# Patient Record
Sex: Male | Born: 1995 | Race: White | Hispanic: Yes | Marital: Single | Smoking: Current every day smoker
Health system: Southern US, Community
[De-identification: ages and names within clinical notes are randomized; demographics above are authoritative.]

---

## 2018-06-03 DIAGNOSIS — J45909 Unspecified asthma, uncomplicated: Secondary | ICD-10-CM | POA: Diagnosis not present

## 2018-06-03 DIAGNOSIS — J069 Acute upper respiratory infection, unspecified: Secondary | ICD-10-CM | POA: Diagnosis not present

## 2018-08-03 DIAGNOSIS — R110 Nausea: Secondary | ICD-10-CM | POA: Diagnosis not present

## 2019-08-16 ENCOUNTER — Encounter (HOSPITAL_COMMUNITY): Payer: Self-pay

## 2019-08-16 ENCOUNTER — Other Ambulatory Visit: Payer: Self-pay

## 2019-08-16 ENCOUNTER — Emergency Department (HOSPITAL_COMMUNITY)
Admission: EM | Admit: 2019-08-16 | Discharge: 2019-08-16 | Disposition: A | Discharge status: Home / Self Care | Payer: Worker's Compensation | Attending: Emergency Medicine | Admitting: Emergency Medicine

## 2019-08-16 ENCOUNTER — Emergency Department (HOSPITAL_COMMUNITY): Payer: Worker's Compensation

## 2019-08-16 DIAGNOSIS — Y939 Activity, unspecified: Secondary | ICD-10-CM | POA: Diagnosis not present

## 2019-08-16 DIAGNOSIS — S60352A Superficial foreign body of left thumb, initial encounter: Secondary | ICD-10-CM | POA: Diagnosis not present

## 2019-08-16 DIAGNOSIS — F17210 Nicotine dependence, cigarettes, uncomplicated: Secondary | ICD-10-CM | POA: Insufficient documentation

## 2019-08-16 DIAGNOSIS — W298XXA Contact with other powered powered hand tools and household machinery, initial encounter: Secondary | ICD-10-CM | POA: Diagnosis not present

## 2019-08-16 DIAGNOSIS — Y929 Unspecified place or not applicable: Secondary | ICD-10-CM | POA: Insufficient documentation

## 2019-08-16 DIAGNOSIS — Y999 Unspecified external cause status: Secondary | ICD-10-CM | POA: Insufficient documentation

## 2019-08-16 MED ORDER — CEPHALEXIN 500 MG PO CAPS: 500.0000 mg | ORAL_CAPSULE | Freq: Four times a day (QID) | ORAL | 0 refills | Status: AC

## 2019-08-16 MED ORDER — LIDOCAINE HCL (PF) 1 % IJ SOLN
10.0000 mL | Freq: Once | INTRAMUSCULAR | Status: AC
  Administered 2019-08-16: 18:00:00 10 mL
  Filled 2019-08-16: qty 10

## 2019-08-16 MED ORDER — TETANUS-DIPHTH-ACELL PERTUSSIS 5-2.5-18.5 LF-MCG/0.5 IM SUSP
0.5000 mL | Freq: Once | INTRAMUSCULAR | Status: DC
  Filled 2019-08-16: qty 0.5

## 2019-08-16 MED ORDER — CEPHALEXIN 250 MG PO CAPS
500.0000 mg | ORAL_CAPSULE | Freq: Once | ORAL | Status: AC
  Administered 2019-08-16: 18:00:00 500 mg via ORAL
  Filled 2019-08-16: qty 2

## 2019-08-16 NOTE — ED Triage Notes (Signed)
Pt reports he was working this morning and stapled his finger. Pt has a staple in his left thumb.

## 2019-08-16 NOTE — ED Provider Notes (Signed)
[name] EMERGENCY DEPARTMENT Provider Note   CSN: [id number] Arrival date & time: 08/16/19  1611     History   Chief Complaint Chief Complaint  Patient presents with  . Suture / Staple Removal    HPI Keith Terry is a 23 y.o. male who is previously healthy who presents with staple through his left hand.  He is working with a [employer] gun and accidentally hit his left thumb.  He is right-handed.  He has no range of motion difficulties or sensation deficits.  He was sent from a clinic for further evaluation.  His tetanus is updated there.  He denies any other injuries.     HPI  History reviewed. No pertinent past medical history.  There are no active problems to display for this patient.   History reviewed. No pertinent surgical history.      Home Medications    Prior to Admission medications   Medication Sig Start Date End Date Taking? Authorizing Provider  cephALEXin (KEFLEX) 500 MG capsule Take 1 capsule (500 mg total) by mouth 4 (four) times daily. 08/16/19   Emi Holes, PA-C    Family History History reviewed. No pertinent family history.  Social History Social History   Tobacco Use  . Smoking status: Current Every Day Smoker    Types: Cigarettes  . Smokeless tobacco: Never Used  Substance Use Topics  . Alcohol use: Not Currently  . Drug use: Not Currently     Allergies   Patient has no known allergies.   Review of Systems Review of Systems  Constitutional: Negative for fever.  Musculoskeletal: Positive for arthralgias.  Skin: Positive for wound.  Neurological: Negative for numbness.     Physical Exam Updated Vital Signs BP 134/90   Pulse 62   Temp 98.8 F (37.1 C) (Oral)   Resp 16   SpO2 100%   Physical Exam Vitals signs and nursing note reviewed.  Constitutional:      General: He is not in acute distress.    Appearance: He is well-developed. He is not diaphoretic.  HENT:     Head:  Normocephalic and atraumatic.     Mouth/Throat:     Pharynx: No oropharyngeal exudate.  Eyes:     General: No scleral icterus.       Right eye: No discharge.        Left eye: No discharge.     Conjunctiva/sclera: Conjunctivae normal.     Pupils: Pupils are equal, round, and reactive to light.  Neck:     Musculoskeletal: Normal range of motion and neck supple.     Thyroid: No thyromegaly.  Cardiovascular:     Rate and Rhythm: Normal rate and regular rhythm.     Heart sounds: Normal heart sounds. No murmur. No friction rub. No gallop.   Pulmonary:     Effort: Pulmonary effort is normal. No respiratory distress.     Breath sounds: Normal breath sounds. No stridor. No wheezing or rales.  Musculoskeletal:       Hands:  Lymphadenopathy:     Cervical: No cervical adenopathy.  Skin:    General: Skin is warm and dry.     Coloration: Skin is not pale.     Findings: No rash.  Neurological:     Mental Status: He is alert.     Coordination: Coordination normal.      ED Treatments / Results  Labs (all labs ordered are listed, but only abnormal results are displayed)  Labs Reviewed - No data to display  EKG None  Radiology Dg Finger Thumb Left  Result Date: 08/16/2019 CLINICAL DATA:  Staple injury. EXAM: LEFT THUMB 2+V COMPARISON:  None. FINDINGS: Staple within the thumb at the level of the proximal aspect of the distal phalanx. One of the 2 arms of the staple probably enters the bone. No visible fracture otherwise. The other arm of the staple appears to be within the soft tissues. IMPRESSION: Staple within the distal thumb, 1 arm probably within the bone of the proximal aspect of the distal phalanx. Electronically Signed   By: Nelson Chimes M.D.   On: 08/16/2019 17:45    Procedures .Foreign Body Removal  Date/Time: 08/17/2019 3:12 PM Performed by: Frederica Kuster, PA-C Authorized by: Frederica Kuster, PA-C  Consent: Verbal consent obtained. Risks and benefits: risks, benefits  and alternatives were discussed Consent given by: patient Patient identity confirmed: verbally with patient Body area: skin General location: upper extremity Location details: left thumb Anesthesia: digital block  Anesthesia: Local Anesthetic: lidocaine 1% without epinephrine Anesthetic total: 3 mL  Sedation: Patient sedated: no  Patient restrained: no Patient cooperative: yes Localization method: x-ray. Removal mechanism: wire cutter pliers. Dressing: antibiotic ointment Tendon involvement: none Depth: deep Complexity: complex 1 objects recovered. Objects recovered: construction grade staple intact Post-procedure assessment: foreign body removed Patient tolerance: patient tolerated the procedure well with no immediate complications Comments: Digital block performed by Krista Blue, PA-C   (including critical care time)  Medications Ordered in ED Medications  lidocaine (PF) (XYLOCAINE) 1 % injection 10 mL (10 mLs Infiltration Given 08/16/19 1822)  cephALEXin (KEFLEX) capsule 500 mg (500 mg Oral Given 08/16/19 1823)     Initial Impression / Assessment and Plan / ED Course  I have reviewed the triage vital signs and the nursing notes.  Pertinent labs & imaging results that were available during my care of the patient were reviewed by me and considered in my medical decision making (see chart for details).  Clinical Course as of Aug 16 1513  Wed Aug 16, 2019  1812 DG Finger Thumb Left [SS]    Clinical Course User Index [SS] Haydee Salter, Student-PA       Patient with construction grade staple in the left thumb.  X-ray showed the staple within the distal thumb, possibly within the bone, but no fracture noted.  Patient is full range of motion.  No significant tenderness.  Staple removed as above with wire cutter pliers.  Copiously irrigated following.  Bacitracin dressing placed.  Will treat with Keflex prophylactically.  Patient given strict return precautions  for signs of infection.  He understands and agrees with plan.  Patient vitals stable throughout ED course and discharged in satisfactory condition. I discussed patient case with Dr. Francia Greaves who guided the patient's management and agrees with plan.   Final Clinical Impressions(s) / ED Diagnoses   Final diagnoses:  Foreign body of left thumb, initial encounter    ED Discharge Orders         Ordered    cephALEXin (KEFLEX) 500 MG capsule  4 times daily     08/16/19 1821           Frederica Kuster, PA-C 08/17/19 1514    Valarie Merino, MD 08/17/19 2245

## 2019-08-16 NOTE — Discharge Instructions (Addendum)
Take Keflex until completed.  Use warm soapy water and wash daily.  Apply antibiotic ointment (bacitracin or Neosporin) and clean dressing daily.  Please return to emergency department develop increasing pain, redness, swelling, drainage, red streaking away from the wound, or fevers.

## 2021-01-01 IMAGING — DX DG FINGER THUMB 2+V*L*
3 series · 3 of 3 positions shown · non-contrast
Comparison: None.

CLINICAL DATA: Staple injury.

EXAM:
LEFT THUMB 2+V

[finger ap]
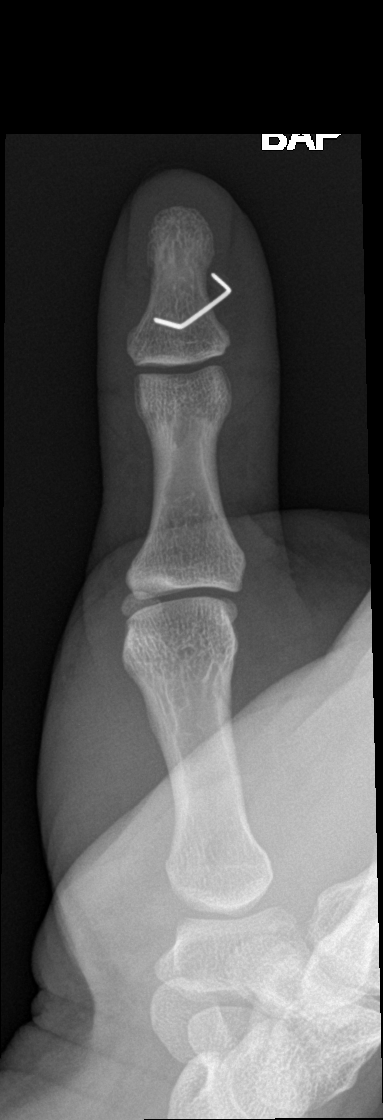

[finger obl]
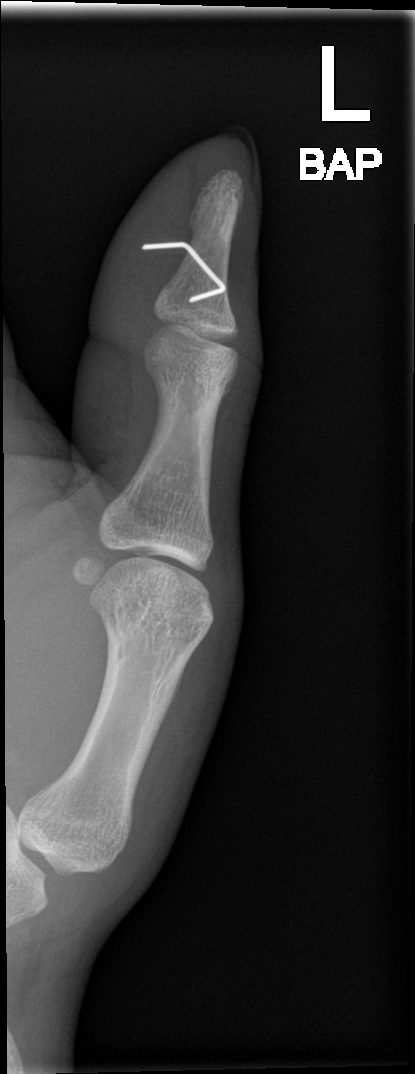

[finger lat]
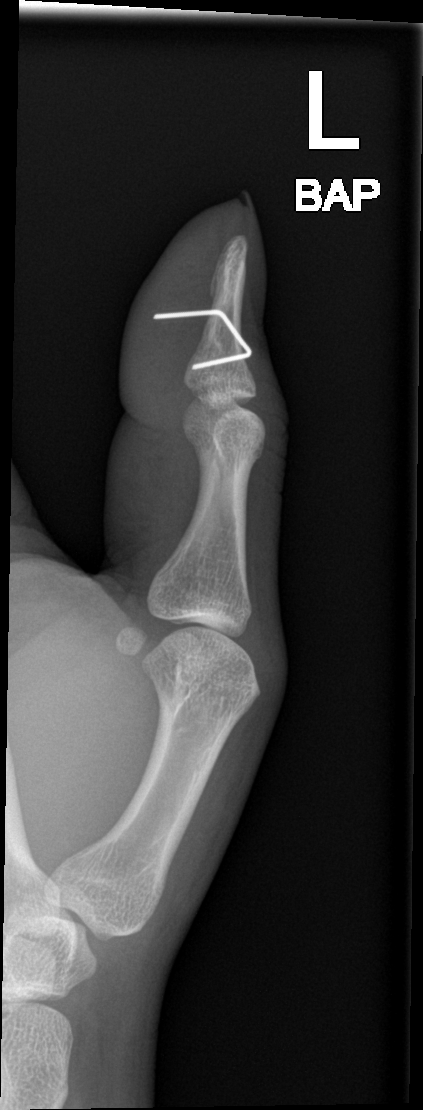

[3 of 3 positions shown; findings below may reference images not displayed]

FINDINGS: Staple within the thumb at the level of the proximal aspect of the
distal phalanx. One of the 2 arms of the staple probably enters the
bone. No visible fracture otherwise. The other arm of the staple
appears to be within the soft tissues.
IMPRESSION: Staple within the distal thumb, 1 arm probably within the bone of
the proximal aspect of the distal phalanx.
